# Patient Record
Sex: Female | Born: 1985 | Race: Black or African American | Hispanic: No | Marital: Single | Smoking: Never smoker
Health system: Southern US, Community
[De-identification: ages and names within clinical notes are randomized; demographics above are authoritative.]

## PROBLEM LIST (undated history)

## (undated) DIAGNOSIS — I10 Essential (primary) hypertension: Secondary | ICD-10-CM

---
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
## 2007-08-07 ENCOUNTER — Inpatient Hospital Stay (HOSPITAL_COMMUNITY): Admission: AD | Admit: 2007-08-07 | Discharge: 2007-08-10 | Payer: Self-pay | Admitting: Obstetrics

## 2007-08-08 ENCOUNTER — Encounter (INDEPENDENT_AMBULATORY_CARE_PROVIDER_SITE_OTHER): Payer: Self-pay | Admitting: Obstetrics

## 2008-07-16 ENCOUNTER — Inpatient Hospital Stay (HOSPITAL_COMMUNITY): Admission: AD | Admit: 2008-07-16 | Discharge: 2008-07-17 | Payer: Self-pay | Admitting: Obstetrics

## 2008-07-18 ENCOUNTER — Inpatient Hospital Stay (HOSPITAL_COMMUNITY): Admission: AD | Admit: 2008-07-18 | Discharge: 2008-07-20 | Payer: Self-pay | Admitting: Obstetrics

## 2009-10-30 ENCOUNTER — Ambulatory Visit: Payer: Self-pay | Admitting: Obstetrics and Gynecology

## 2009-10-30 ENCOUNTER — Inpatient Hospital Stay (HOSPITAL_COMMUNITY): Admission: AD | Admit: 2009-10-30 | Discharge: 2009-10-30 | Payer: Self-pay | Admitting: Obstetrics & Gynecology

## 2009-11-15 ENCOUNTER — Ambulatory Visit: Payer: Self-pay | Admitting: Obstetrics and Gynecology

## 2009-11-16 ENCOUNTER — Encounter: Payer: Self-pay | Admitting: Obstetrics and Gynecology

## 2009-11-29 ENCOUNTER — Ambulatory Visit: Payer: Self-pay | Admitting: Obstetrics and Gynecology

## 2009-11-30 ENCOUNTER — Encounter: Payer: Self-pay | Admitting: Obstetrics and Gynecology

## 2009-11-30 LAB — CONVERTED CEMR LAB
Clue Cells Wet Prep HPF POC: NONE SEEN
Trich, Wet Prep: NONE SEEN

## 2009-12-06 ENCOUNTER — Ambulatory Visit: Payer: Self-pay | Admitting: Obstetrics and Gynecology

## 2009-12-06 ENCOUNTER — Inpatient Hospital Stay (HOSPITAL_COMMUNITY): Admission: AD | Admit: 2009-12-06 | Discharge: 2009-12-08 | Payer: Self-pay | Admitting: Obstetrics & Gynecology

## 2009-12-06 ENCOUNTER — Ambulatory Visit: Payer: Self-pay | Admitting: Family

## 2010-01-24 ENCOUNTER — Ambulatory Visit: Payer: Self-pay | Admitting: Obstetrics and Gynecology

## 2010-10-01 LAB — CBC
HCT: 38.8 % (ref 36.0–46.0)
Hemoglobin: 13.5 g/dL (ref 12.0–15.0)
MCHC: 34.8 g/dL (ref 30.0–36.0)
RBC: 4.18 MIL/uL (ref 3.87–5.11)
RDW: 13.9 % (ref 11.5–15.5)

## 2010-10-01 LAB — POCT URINALYSIS DIP (DEVICE)
Bilirubin Urine: NEGATIVE
Glucose, UA: NEGATIVE mg/dL
Glucose, UA: NEGATIVE mg/dL
Hgb urine dipstick: NEGATIVE
Ketones, ur: NEGATIVE mg/dL
Ketones, ur: NEGATIVE mg/dL
Nitrite: NEGATIVE
Nitrite: NEGATIVE
Protein, ur: 30 mg/dL — AB
Protein, ur: NEGATIVE mg/dL
Specific Gravity, Urine: 1.02 (ref 1.005–1.030)
Specific Gravity, Urine: >=1.03 (ref 1.005–1.030)
Urobilinogen, UA: 0.2 mg/dL (ref 0.0–1.0)
Urobilinogen, UA: 1 mg/dL (ref 0.0–1.0)
pH: 6 (ref 5.0–8.0)
pH: 7 (ref 5.0–8.0)

## 2010-10-02 LAB — URINE MICROSCOPIC-ADD ON

## 2010-10-02 LAB — POCT URINALYSIS DIP (DEVICE)
Bilirubin Urine: NEGATIVE
Nitrite: NEGATIVE
Specific Gravity, Urine: 1.025 (ref 1.005–1.030)
pH: 7 (ref 5.0–8.0)

## 2010-10-02 LAB — CBC
Hemoglobin: 12.5 g/dL (ref 12.0–15.0)
MCHC: 33.9 g/dL (ref 30.0–36.0)
MCV: 92.9 fL (ref 78.0–100.0)
RBC: 3.98 MIL/uL (ref 3.87–5.11)

## 2010-10-02 LAB — URINALYSIS, ROUTINE W REFLEX MICROSCOPIC
Bilirubin Urine: NEGATIVE
Glucose, UA: NEGATIVE mg/dL
Hgb urine dipstick: NEGATIVE
Specific Gravity, Urine: 1.025 (ref 1.005–1.030)
Urobilinogen, UA: 0.2 mg/dL (ref 0.0–1.0)

## 2010-10-02 LAB — DIFFERENTIAL
Basophils Relative: 0 % (ref 0–1)
Eosinophils Absolute: 0.2 10*3/uL (ref 0.0–0.7)
Monocytes Absolute: 0.5 10*3/uL (ref 0.1–1.0)
Monocytes Relative: 5 % (ref 3–12)
Neutro Abs: 7.5 10*3/uL (ref 1.7–7.7)

## 2010-10-02 LAB — RAPID URINE DRUG SCREEN, HOSP PERFORMED
Barbiturates: NOT DETECTED
Benzodiazepines: NOT DETECTED

## 2010-10-02 LAB — TYPE AND SCREEN: ABO/RH(D): AB POS

## 2010-10-02 LAB — RPR: RPR Ser Ql: NONREACTIVE

## 2010-10-29 LAB — CBC
HCT: 34.5 % — ABNORMAL LOW (ref 36.0–46.0)
HCT: 41.2 % (ref 36.0–46.0)
Hemoglobin: 11.9 g/dL — ABNORMAL LOW (ref 12.0–15.0)
Hemoglobin: 14 g/dL (ref 12.0–15.0)
MCHC: 34.5 g/dL (ref 30.0–36.0)
MCV: 92.7 fL (ref 78.0–100.0)
MCV: 93.4 fL (ref 78.0–100.0)
RBC: 3.69 MIL/uL — ABNORMAL LOW (ref 3.87–5.11)
RBC: 4.52 MIL/uL (ref 3.87–5.11)
RDW: 14 % (ref 11.5–15.5)
WBC: 15.4 10*3/uL — ABNORMAL HIGH (ref 4.0–10.5)

## 2010-10-29 LAB — RPR: RPR Ser Ql: NONREACTIVE

## 2011-03-28 ENCOUNTER — Emergency Department (HOSPITAL_COMMUNITY)
Admission: EM | Admit: 2011-03-28 | Discharge: 2011-03-28 | Discharge status: Against Medical Advice | Payer: Self-pay | Attending: Emergency Medicine | Admitting: Emergency Medicine

## 2011-03-28 DIAGNOSIS — L02219 Cutaneous abscess of trunk, unspecified: Secondary | ICD-10-CM | POA: Insufficient documentation

## 2011-04-04 LAB — COMPREHENSIVE METABOLIC PANEL
ALT: 18
AST: 21
AST: 22
Albumin: 2.3 — ABNORMAL LOW
Albumin: 2.7 — ABNORMAL LOW
CO2: 26
Calcium: 8.2 — ABNORMAL LOW
Calcium: 9.3
Creatinine, Ser: 0.56
Creatinine, Ser: 0.59
GFR calc Af Amer: >60
GFR calc Af Amer: >60
Sodium: 136
Total Protein: 5.5 — ABNORMAL LOW
Total Protein: 6.1

## 2011-04-04 LAB — URIC ACID: Uric Acid, Serum: 4.8

## 2011-04-04 LAB — RPR: RPR Ser Ql: NONREACTIVE

## 2011-04-04 LAB — CBC
MCHC: 34.7
MCHC: 34.8
MCV: 92.7
Platelets: 265
Platelets: 297
RBC: 3.87
RDW: 13.6
RDW: 13.7
WBC: 11.1 — ABNORMAL HIGH

## 2011-08-22 ENCOUNTER — Emergency Department (HOSPITAL_BASED_OUTPATIENT_CLINIC_OR_DEPARTMENT_OTHER)
Admission: EM | Admit: 2011-08-22 | Discharge: 2011-08-22 | Disposition: A | Discharge status: Home / Self Care | Payer: Self-pay | Attending: Emergency Medicine | Admitting: Emergency Medicine

## 2011-08-22 ENCOUNTER — Encounter (HOSPITAL_BASED_OUTPATIENT_CLINIC_OR_DEPARTMENT_OTHER): Payer: Self-pay | Admitting: *Deleted

## 2011-08-22 ENCOUNTER — Emergency Department (INDEPENDENT_AMBULATORY_CARE_PROVIDER_SITE_OTHER): Payer: Self-pay

## 2011-08-22 ENCOUNTER — Other Ambulatory Visit: Payer: Self-pay

## 2011-08-22 DIAGNOSIS — R079 Chest pain, unspecified: Secondary | ICD-10-CM | POA: Insufficient documentation

## 2011-08-22 DIAGNOSIS — R0602 Shortness of breath: Secondary | ICD-10-CM | POA: Insufficient documentation

## 2011-08-22 DIAGNOSIS — R109 Unspecified abdominal pain: Secondary | ICD-10-CM

## 2011-08-22 LAB — PREGNANCY, URINE: Preg Test, Ur: NEGATIVE

## 2011-08-22 LAB — CBC
HCT: 38.8 % (ref 36.0–46.0)
Hemoglobin: 13.5 g/dL (ref 12.0–15.0)
MCHC: 34.8 g/dL (ref 30.0–36.0)
WBC: 6.9 10*3/uL (ref 4.0–10.5)

## 2011-08-22 LAB — BASIC METABOLIC PANEL
BUN: 12 mg/dL (ref 6–23)
Chloride: 103 mEq/L (ref 96–112)
GFR calc Af Amer: >90 mL/min (ref >90)
GFR calc non Af Amer: >90 mL/min (ref >90)
Glucose, Bld: 74 mg/dL (ref 70–99)
Potassium: 3.6 mEq/L (ref 3.5–5.1)
Sodium: 139 mEq/L (ref 135–145)

## 2011-08-22 LAB — TROPONIN I: Troponin I: <0.3 ng/mL (ref <0.30)

## 2011-08-22 LAB — URINALYSIS, ROUTINE W REFLEX MICROSCOPIC
Glucose, UA: NEGATIVE mg/dL
Hgb urine dipstick: NEGATIVE
Leukocytes, UA: NEGATIVE
Specific Gravity, Urine: 1.021 (ref 1.005–1.030)
pH: 6 (ref 5.0–8.0)

## 2011-08-22 NOTE — ED Notes (Signed)
Secondary Assessment- Pt reports lower abdominal pain since 2011 and insertion of IUD.  Pain is described as intermittent and cramping. She c/o of urinary frequency and vaginal odor.  Denies vaginal d/c or bleeding at present time.  She also reports intermittent chest pain described as tightness and SHOB when in a stressful situation.  Denies chest pain at present time.

## 2011-08-22 NOTE — ED Notes (Signed)
Pt states " I have been feeling like this with my body since June but I havent had time to go get it checked out" the shortness of breath just started today

## 2011-08-22 NOTE — ED Notes (Signed)
Pt concerned that the mirena implant is causing her back and abdominal pain and feels short of breath has had it for one year

## 2011-08-22 NOTE — ED Notes (Signed)
Patient transported to X-ray 

## 2011-08-22 NOTE — ED Provider Notes (Signed)
History     CSN: [id number]  Arrival date & time 08/22/11  1734   First MD Initiated Contact with Patient 08/22/11 1736      Chief Complaint  Patient presents with  . Shortness of Breath  . Abdominal Pain    (Consider location/radiation/quality/duration/timing/severity/associated sxs/prior treatment) Patient is a 26 y.o. female presenting with chest pain.  Chest Pain The chest pain began 1 - 2 weeks ago. Duration of episode(s) is 10 minutes. Chest pain occurs intermittently. The chest pain is unchanged. The pain is associated with exertion. At its most intense, the pain is at 8/10. The pain is currently at 0/10. The severity of the pain is moderate. The quality of the pain is described as aching and sharp. The pain does not radiate. Chest pain is worsened by exertion. Primary symptoms include shortness of breath and palpitations. Pertinent negatives for primary symptoms include no fever, no fatigue, no syncope, no cough, no wheezing, no abdominal pain, no nausea, no vomiting and no dizziness.  The palpitations also occurred with shortness of breath. The palpitations did not occur with dizziness.      History reviewed. No pertinent past medical history.  History reviewed. No pertinent past surgical history.  History reviewed. No pertinent family history.  History  Substance Use Topics  . Smoking status: Never Smoker   . Smokeless tobacco: Not on file  . Alcohol Use: Yes     occ    OB History    Grav Para Term Preterm Abortions TAB SAB Ect Mult Living                  Review of Systems  Constitutional: Negative for fever and fatigue.  HENT: Negative for congestion and neck pain.   Eyes: Negative for visual disturbance.  Respiratory: Positive for shortness of breath. Negative for cough and wheezing.   Cardiovascular: Positive for chest pain and palpitations. Negative for syncope.  Gastrointestinal: Negative for nausea, vomiting and abdominal pain.  Genitourinary:  Negative for dysuria.  Musculoskeletal: Negative for back pain.  Neurological: Negative for dizziness and headaches.  Hematological: Does not bruise/bleed easily.    Allergies  Review of patient's allergies indicates no known allergies.  Home Medications   Current Outpatient Rx  Name Route Sig Dispense Refill  . IBUPROFEN PO Oral Take 2 tablets by mouth daily as needed. Patient used this medication for stomach pain.      BP 128/66  Pulse 76  Temp(Src) 98.3 F (36.8 C) (Oral)  Resp 20  SpO2 99%  LMP 08/13/2011  Physical Exam  Nursing note and vitals reviewed. Constitutional: She is oriented to person, place, and time. She appears well-developed and well-nourished. No distress.  HENT:  Head: Normocephalic and atraumatic.  Mouth/Throat: Oropharynx is clear and moist.  Eyes: Conjunctivae and EOM are normal. Pupils are equal, round, and reactive to light.  Neck: Normal range of motion. Neck supple.  Cardiovascular: Normal rate, regular rhythm and normal heart sounds.   No murmur heard. Pulmonary/Chest: Effort normal and breath sounds normal. She has no wheezes. She exhibits no tenderness.  Abdominal: Soft. Bowel sounds are normal. There is no tenderness.  Musculoskeletal: Normal range of motion. She exhibits no edema.  Neurological: She is alert and oriented to person, place, and time. No cranial nerve deficit. She exhibits normal muscle tone.  Skin: Skin is warm. No rash noted.    ED Course  Procedures (including critical care time)  Labs Reviewed  URINALYSIS, ROUTINE W REFLEX MICROSCOPIC -  Abnormal; Notable for the following:    Ketones, ur 15 (*)    All other components within normal limits  PREGNANCY, URINE  CBC  BASIC METABOLIC PANEL  D-DIMER, QUANTITATIVE  TROPONIN I   Dg Chest 2 View  08/22/2011  *RADIOLOGY REPORT*  Clinical Data: 26 year old female with mid chest pain, shortness of breath.  CHEST - 2 VIEW  Comparison: 11/21/2004.  Findings: Slightly shallow  lung volumes felt to accentuate cardiac size.  Cardiac size and mediastinal contours are within normal limits.  Visualized tracheal air column is within normal limits. The lungs are clear.  No pneumothorax or effusion. No osseous abnormality identified.  IMPRESSION: Negative, no acute cardiopulmonary abnormality.  Original Report Authenticated By: Harley Hallmark, M.D.   Results for orders placed during the hospital encounter of 08/22/11  URINALYSIS, ROUTINE W REFLEX MICROSCOPIC      Component Value Range   Color, Urine YELLOW  YELLOW    APPearance CLEAR  CLEAR    Specific Gravity, Urine 1.021  1.005 - 1.030    pH 6.0  5.0 - 8.0    Glucose, UA NEGATIVE  NEGATIVE (mg/dL)   Hgb urine dipstick NEGATIVE  NEGATIVE    Bilirubin Urine NEGATIVE  NEGATIVE    Ketones, ur 15 (*) NEGATIVE (mg/dL)   Protein, ur NEGATIVE  NEGATIVE (mg/dL)   Urobilinogen, UA 0.2  0.0 - 1.0 (mg/dL)   Nitrite NEGATIVE  NEGATIVE    Leukocytes, UA NEGATIVE  NEGATIVE   PREGNANCY, URINE      Component Value Range   Preg Test, Ur NEGATIVE  NEGATIVE   CBC      Component Value Range   WBC 6.9  4.0 - 10.5 (K/uL)   RBC 4.47  3.87 - 5.11 (MIL/uL)   Hemoglobin 13.5  12.0 - 15.0 (g/dL)   HCT 16.1  09.6 - 04.5 (%)   MCV 86.8  78.0 - 100.0 (fL)   MCH 30.2  26.0 - 34.0 (pg)   MCHC 34.8  30.0 - 36.0 (g/dL)   RDW 40.9  81.1 - 91.4 (%)   Platelets 288  150 - 400 (K/uL)  BASIC METABOLIC PANEL      Component Value Range   Sodium 139  135 - 145 (mEq/L)   Potassium 3.6  3.5 - 5.1 (mEq/L)   Chloride 103  96 - 112 (mEq/L)   CO2 25  19 - 32 (mEq/L)   Glucose, Bld 74  70 - 99 (mg/dL)   BUN 12  6 - 23 (mg/dL)   Creatinine, Ser 7.82  0.50 - 1.10 (mg/dL)   Calcium 9.9  8.4 - 95.6 (mg/dL)   GFR calc non Af Amer >90  >90 (mL/min)   GFR calc Af Amer >90  >90 (mL/min)  D-DIMER, QUANTITATIVE      Component Value Range   D-Dimer, Quant <0.22  0.00 - 0.48 (ug/mL-FEU)  TROPONIN I      Component Value Range   Troponin I <0.30  <0.30 (ng/mL)     Date: 08/22/2011  Rate: 83  Rhythm: normal sinus rhythm  QRS Axis: normal  Intervals: normal  ST/T Wave abnormalities: nonspecific ST/T changes  Conduction Disutrbances:right bundle branch block  Narrative Interpretation:   Old EKG Reviewed: none available    1. Chest pain   2. Shortness of breath       MDM   Patient with a complaint of chest pain for one week occasional feeling of palpitations and shortness of breath. Patient with new job and  thinks it may be related to anxiety. Workup in the emergency department negative for pneumonia or pneumothorax cardiac arrhythmia pulmonary embolism or acute cardiac event. Patient informed of the finding of a right bundle branch block on her EKG but not likely to be related to her symptoms.       Shelda Jakes, MD 08/22/11 [phone]

## 2011-12-16 IMAGING — US US OB COMP +14 WK
1 series · 14 of 28 positions shown · non-contrast
Comparison: none

OBSTETRICAL ULTRASOUND:
 This ultrasound exam was performed in the [HOSPITAL] Ultrasound Department.  The OB US report was generated in the AS system, and faxed to the ordering physician.  This report is also available in [HOSPITAL]?s [employer] and in [REDACTED] PACS.

[Series 1: us ob comp +14 wk · 0.37mm/px · 14 of 43 slices shown]
[im 2/43]
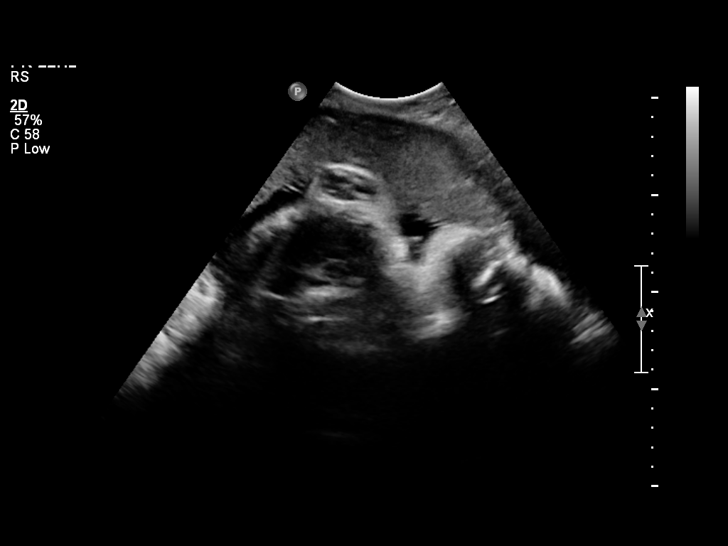
[im 5/43]
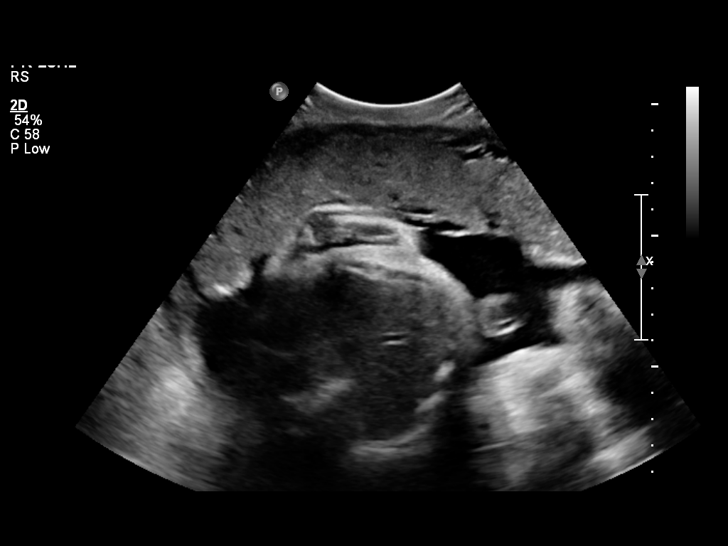
[im 8/43]
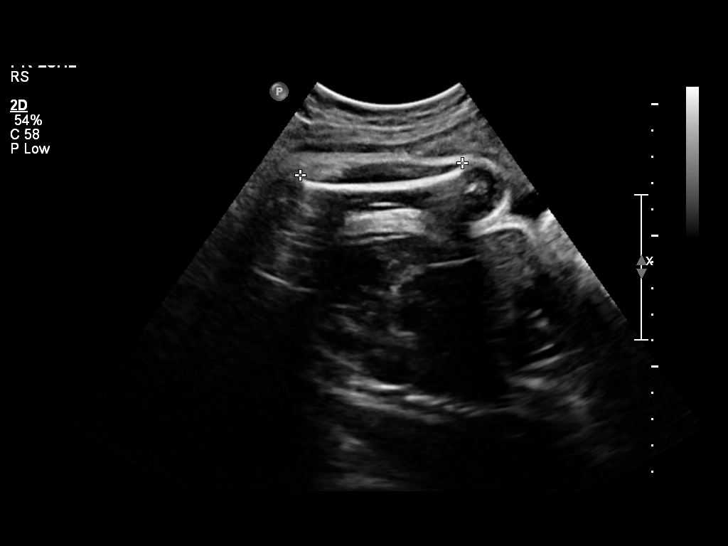
[im 11/43]
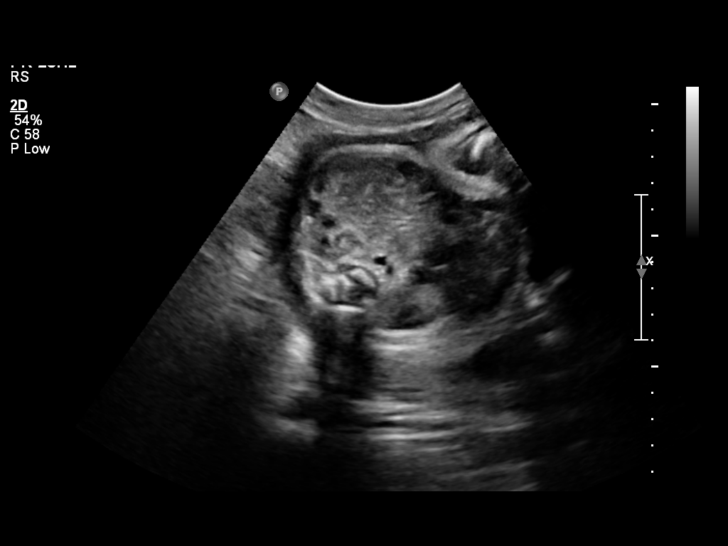
[im 15/43]
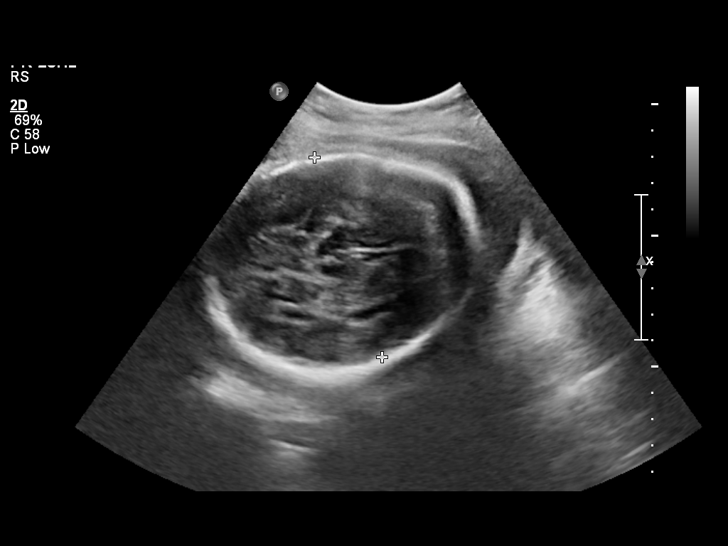
[im 18/43]
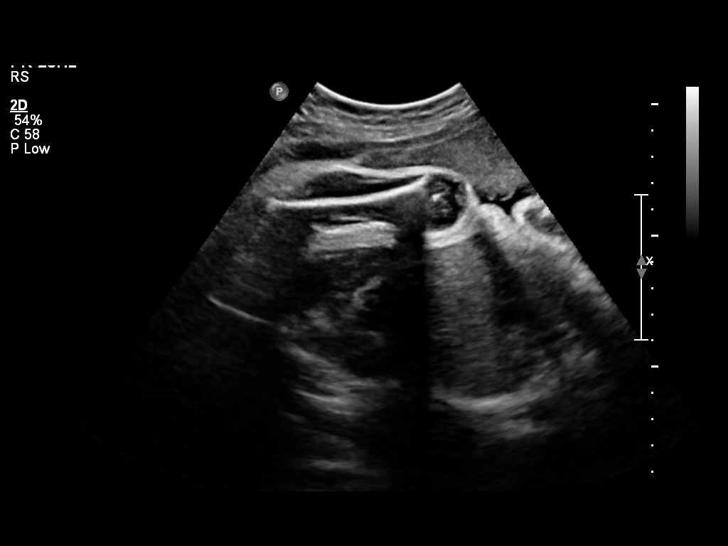
[im 21/43]
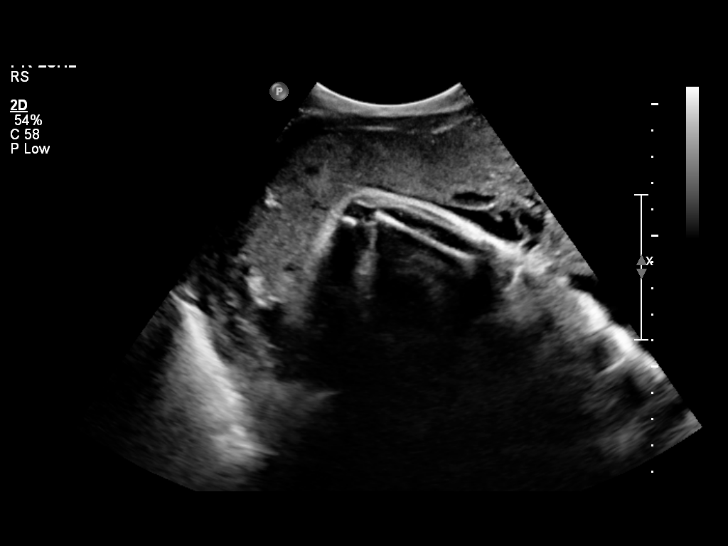
[im 24/43]
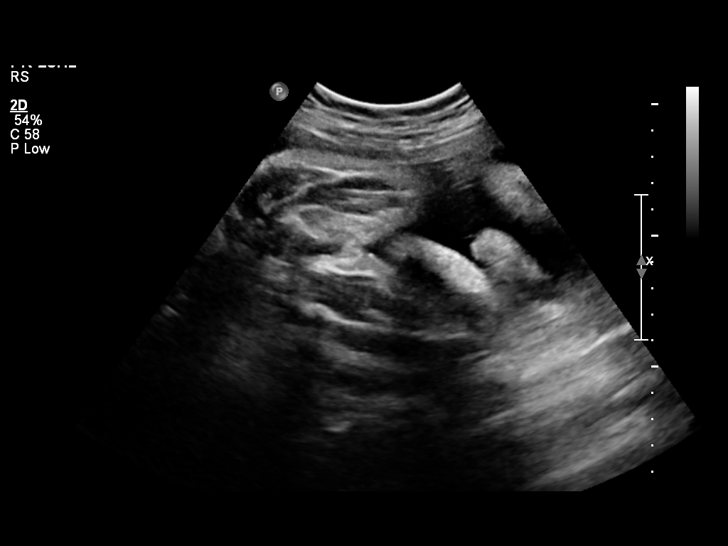
[im 27/43]
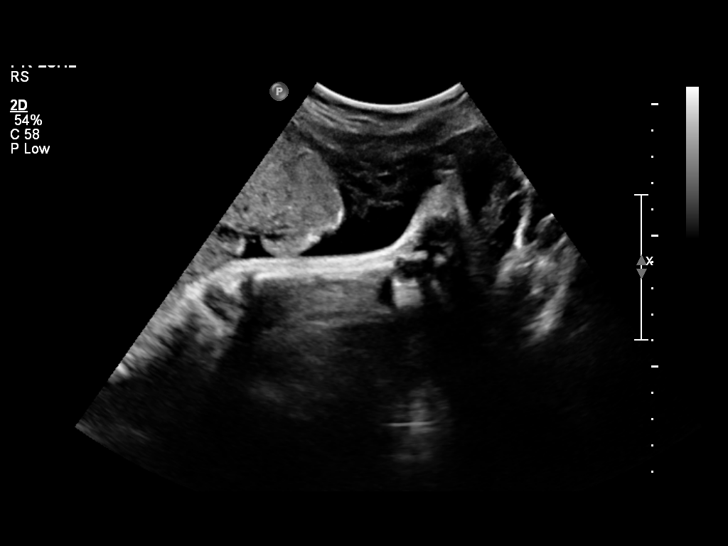
[im 30/43]
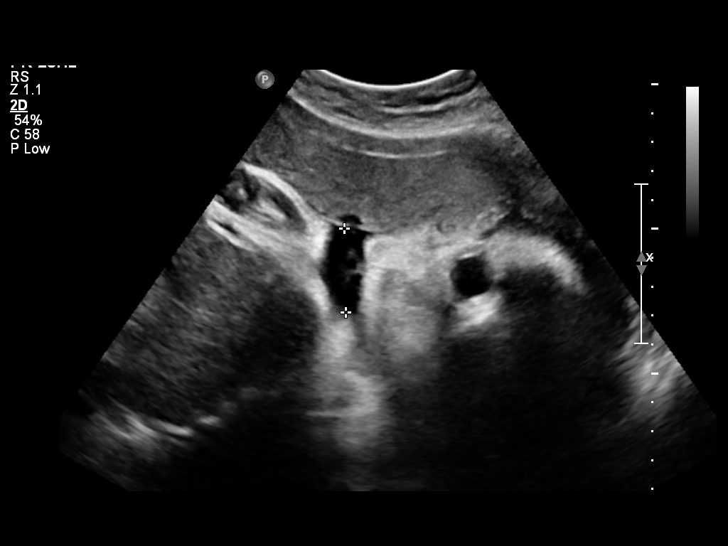
[im 33/43]
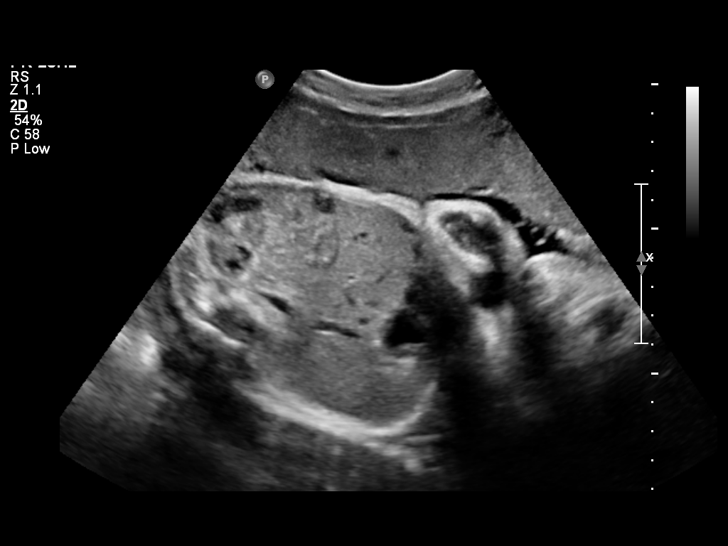
[im 36/43]
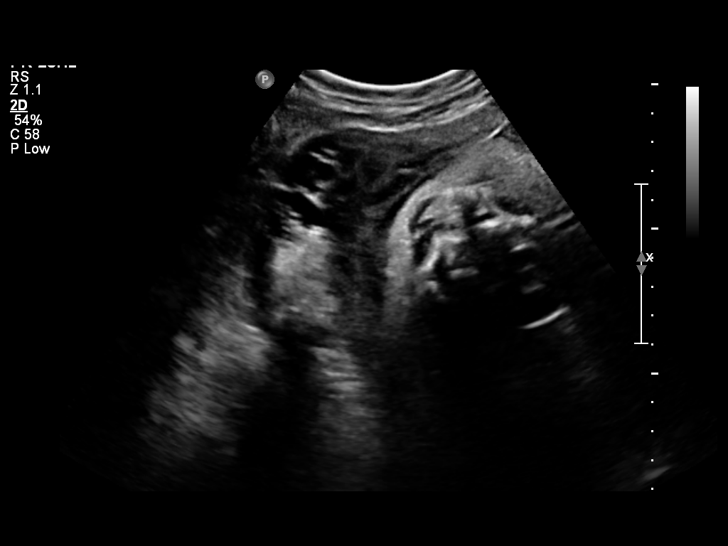
[im 39/43]
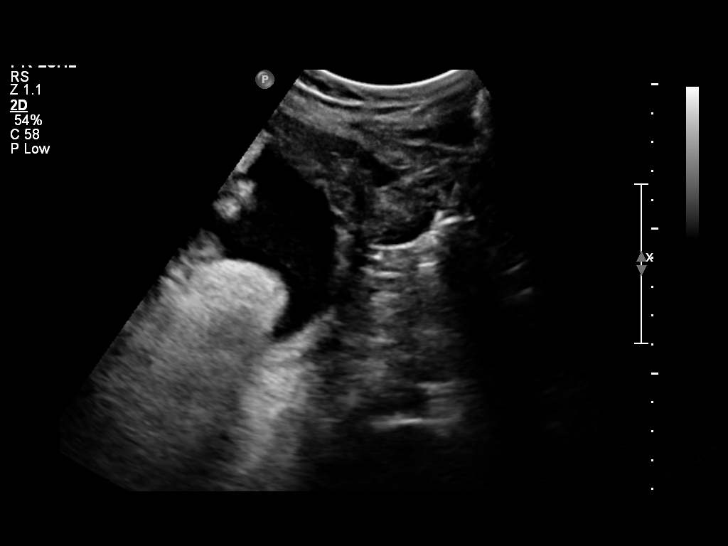
[im 43/43]
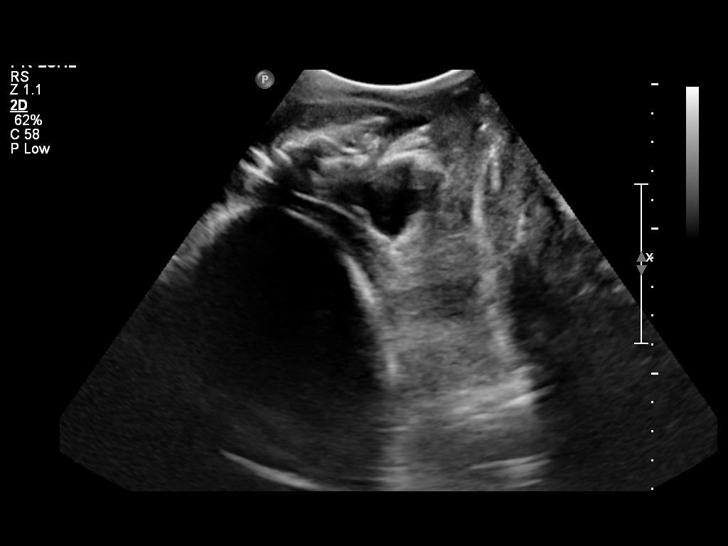

[14 of 28 positions shown; findings below may reference images not displayed]

IMPRESSION: See [facility] Obstetric US report.

## 2016-05-05 ENCOUNTER — Emergency Department (HOSPITAL_COMMUNITY)
Admission: EM | Admit: 2016-05-05 | Discharge: 2016-05-05 | Disposition: A | Discharge status: Home / Self Care | Payer: Self-pay | Attending: Emergency Medicine | Admitting: Emergency Medicine

## 2016-05-05 ENCOUNTER — Emergency Department (HOSPITAL_COMMUNITY): Payer: Self-pay

## 2016-05-05 ENCOUNTER — Encounter (HOSPITAL_COMMUNITY): Payer: Self-pay | Admitting: Emergency Medicine

## 2016-05-05 DIAGNOSIS — I10 Essential (primary) hypertension: Secondary | ICD-10-CM | POA: Insufficient documentation

## 2016-05-05 DIAGNOSIS — Y9389 Activity, other specified: Secondary | ICD-10-CM | POA: Insufficient documentation

## 2016-05-05 DIAGNOSIS — M25571 Pain in right ankle and joints of right foot: Secondary | ICD-10-CM | POA: Insufficient documentation

## 2016-05-05 DIAGNOSIS — Y929 Unspecified place or not applicable: Secondary | ICD-10-CM | POA: Insufficient documentation

## 2016-05-05 DIAGNOSIS — Y999 Unspecified external cause status: Secondary | ICD-10-CM | POA: Insufficient documentation

## 2016-05-05 DIAGNOSIS — X501XXA Overexertion from prolonged static or awkward postures, initial encounter: Secondary | ICD-10-CM | POA: Insufficient documentation

## 2016-05-05 HISTORY — DX: Essential (primary) hypertension: I10

## 2016-05-05 MED ORDER — HYDROCODONE-ACETAMINOPHEN 5-325 MG PO TABS
1.0000 | ORAL_TABLET | Freq: Once | ORAL | Status: AC
  Administered 2016-05-05: 1 via ORAL
  Filled 2016-05-05: qty 1

## 2016-05-05 MED ORDER — IBUPROFEN 800 MG PO TABS: 800.0000 mg | ORAL_TABLET | Freq: Three times a day (TID) | ORAL | 0 refills | Status: DC

## 2016-05-05 MED ORDER — HYDROCODONE-ACETAMINOPHEN 5-325 MG PO TABS: 1.0000 | ORAL_TABLET | Freq: Four times a day (QID) | ORAL | 0 refills | Status: DC | PRN

## 2016-05-05 NOTE — Discharge Instructions (Signed)
Medications: Norco, ibuprofen  Treatment: Take Norco every 4-6 hours as needed for severe pain. Do not drive or operate machinery when taking this medication and only take as prescribed. Take ibuprofen every 8 hours as needed for mild to moderate pain or when you're driving. Use ice 3-4 times daily alternating 20 minutes on, 20 minutes off. Keep your splint on at all times except when bathing. Keep your leg elevated whenever you are sitting or laying down. Use crutches to ambulate carefully.  Follow-up: Please follow-up with Dr. Roda ShuttersXu, an orthopedic doctor, if your symptoms are not improving over the next 10-14 days. Please return to emergency department if you develop any new or worsening symptoms.

## 2016-05-05 NOTE — ED Provider Notes (Signed)
[facility] Provider Note   CSN: [id number] Arrival date & time: 05/05/16  1911  By signing my name below, I, Sheila Arroyo, attest that this documentation has been prepared under the direction and in the presence of  No att. providers found. Electronically Signed: Lennie Muckleyan Arroyo, Scribe. 05/06/16. 10:12 AM.   History   Chief Complaint Chief Complaint  Patient presents with  . Ankle Injury   The history is provided by the patient. No language interpreter was used.  Ankle Injury  Pertinent negatives include no chest pain, no abdominal pain, no headaches and no shortness of breath.     HPI Comments: Sheila Arroyo is a 30 y.o. female who presents to the Emergency Department complaining of right ankle pain after twisting it earlier today while using stairs. The pain has gotten progressively worse as the day goes on and cannot walk on the right foot. Had some tingling in the right ankle but this resolved. Does not remember which way the ankle twisted. Took Aleve for pain relief.   Past Medical History:  Diagnosis Date  . Hypertension     There are no active problems to display for this patient.   No past surgical history on file.  OB History    No data available       Home Medications    Prior to Admission medications   Medication Sig Start Date End Date Taking? Authorizing Provider  HYDROcodone-acetaminophen (NORCO/VICODIN) 5-325 MG tablet Take 1-2 tablets by mouth every 6 (six) hours as needed. 05/05/16   Emi HolesAlexandra M Ovella Manygoats, PA-C  ibuprofen (ADVIL,MOTRIN) 800 MG tablet Take 1 tablet (800 mg total) by mouth 3 (three) times daily. 05/05/16   Emi HolesAlexandra M Dervin Vore, PA-C    Family History No family history on file.  Social History Social History  Substance Use Topics  . Smoking status: Never Smoker  . Smokeless tobacco: Not on file  . Alcohol use Yes     Comment: occ     Allergies   Review of patient's allergies indicates no known allergies.   Review of  Systems Review of Systems  Constitutional: Negative for chills and fever.  HENT: Negative for facial swelling and sore throat.   Respiratory: Negative for shortness of breath.   Cardiovascular: Negative for chest pain.  Gastrointestinal: Negative for abdominal pain, nausea and vomiting.  Genitourinary: Negative for dysuria.  Musculoskeletal: Negative for back pain.       Right ankle pain  Skin: Negative for rash and wound.  Neurological: Negative for headaches.  Psychiatric/Behavioral: The patient is not nervous/anxious.      Physical Exam Updated Vital Signs BP (!) 151/106 (BP Location: Right Arm)   Pulse 115   Temp 98.4 F (36.9 C) (Oral)   Resp 18   LMP 04/14/2016 (Approximate)   SpO2 100%   Physical Exam  Constitutional: She appears well-developed and well-nourished. No distress.  HENT:  Head: Normocephalic and atraumatic.  Mouth/Throat: Oropharynx is clear and moist. No oropharyngeal exudate.  Eyes: Conjunctivae are normal. Pupils are equal, round, and reactive to light. Right eye exhibits no discharge. Left eye exhibits no discharge. No scleral icterus.  Neck: Normal range of motion. Neck supple. No thyromegaly present.  Cardiovascular: Normal rate, regular rhythm and normal heart sounds.  Exam reveals no gallop and no friction rub.   No murmur heard. Pulmonary/Chest: Effort normal and breath sounds normal. No stridor. No respiratory distress. She has no wheezes. She has no rales.  Abdominal: Soft. Bowel sounds are normal.  She exhibits no distension. There is no tenderness. There is no rebound and no guarding.  Musculoskeletal: She exhibits no edema.  Anterior tenderness, mild lateral malleolar tenderness, DP pulses intact, no tenderness at base of 5th metacarpal or fibular head  Lymphadenopathy:    She has no cervical adenopathy.  Neurological: She is alert. Coordination normal.  Skin: Skin is warm and dry. No rash noted. She is not diaphoretic. No pallor.   Psychiatric: She has a normal mood and affect.  Nursing note and vitals reviewed.    ED Treatments / Results  Labs (all labs ordered are listed, but only abnormal results are displayed) Labs Reviewed - No data to display  EKG  EKG Interpretation None       Radiology Dg Ankle Complete Right  Result Date: 05/05/2016 CLINICAL DATA:  Twisted right ankle on stairs at home earlier today. Lateral ankle swelling. Initial encounter. EXAM: RIGHT ANKLE - COMPLETE 3+ VIEW COMPARISON:  None. FINDINGS: There is no evidence of fracture, dislocation, or joint effusion. There is no evidence of arthropathy or other focal bone abnormality. Soft tissues are unremarkable. IMPRESSION: Negative. Electronically Signed   By: Sebastian Ache M.D.   On: 05/05/2016 20:40    Procedures Procedures (including critical care time)  Medications Ordered in ED Medications  HYDROcodone-acetaminophen (NORCO/VICODIN) 5-325 MG per tablet 1 tablet (1 tablet Oral Given 05/05/16 2122)     Initial Impression / Assessment and Plan / ED Course  I have reviewed the triage vital signs and the nursing notes.  Pertinent labs & imaging results that were available during my care of the patient were reviewed by me and considered in my medical decision making (see chart for details).  Clinical Course   MDM Patient X-Ray negative for obvious fracture or dislocation.  Pt advised to follow up with orthopedics as needed. Patient given ASO splint and crutches while in ED, conservative therapy recommended and discussed.Discharged home with short course of analgesia. Patient will be discharged home & is agreeable with above plan. Returns precautions discussed. Pt appears safe for discharge.  I personally performed the services described in this documentation, which was scribed in my presence. The recorded information has been reviewed and is accurate.   Final Clinical Impressions(s) / ED Diagnoses   Final diagnoses:  Acute right  ankle pain    New Prescriptions Discharge Medication List as of 05/05/2016  9:09 PM    START taking these medications   Details  HYDROcodone-acetaminophen (NORCO/VICODIN) 5-325 MG tablet Take 1-2 tablets by mouth every 6 (six) hours as needed., Starting Sun 05/05/2016, Print          Emi Holes, PA-C 05/06/16 1012    Rolan Bucco, MD 05/10/16 [phone]

## 2016-05-05 NOTE — ED Notes (Signed)
1920 pain scale reported by patient 8/10

## 2016-05-05 NOTE — ED Notes (Signed)
Patient d/c'd self care.  F/U and medications reviewed.  Patient verbalized understanding. 

## 2016-05-05 NOTE — ED Triage Notes (Signed)
Pt states that she twisted her R ankle earlier today and the pain got worse throughout the day. Alert and oriented. No deformity noted.

## 2019-11-06 ENCOUNTER — Ambulatory Visit (HOSPITAL_COMMUNITY)
Admission: EM | Admit: 2019-11-06 | Discharge: 2019-11-06 | Disposition: A | Discharge status: Home / Self Care | Payer: PRIVATE HEALTH INSURANCE

## 2019-11-06 ENCOUNTER — Other Ambulatory Visit: Payer: Self-pay

## 2019-11-06 ENCOUNTER — Encounter (HOSPITAL_COMMUNITY): Payer: Self-pay

## 2019-11-06 DIAGNOSIS — M79672 Pain in left foot: Secondary | ICD-10-CM

## 2019-11-06 DIAGNOSIS — M7989 Other specified soft tissue disorders: Secondary | ICD-10-CM

## 2019-11-06 DIAGNOSIS — S96912A Strain of unspecified muscle and tendon at ankle and foot level, left foot, initial encounter: Secondary | ICD-10-CM

## 2019-11-06 MED ORDER — PREDNISONE 20 MG PO TABS
ORAL_TABLET | ORAL | 0 refills | Status: DC
Start: 2019-11-06 — End: 2021-11-13

## 2019-11-06 NOTE — ED Triage Notes (Signed)
Pt is here with left foot pain that started Tuesday, pt has taken Advil, ice, elevated it to relieve discomfort.

## 2019-11-06 NOTE — ED Provider Notes (Addendum)
  [facility]   MRN: [id number] DOB: 09-May-1986  Subjective:   Sheila Arroyo is a 34 y.o. female presenting for 5-day history of persistent left sided foot pain.  Patient cannot recall any particular trauma but thinks she may have twisted her foot, she did use shoes she does not normally use as well.  Patient has used Advil, Aleve and icing.  Has elevated as well.  However, she has worked throughout the entire week and has not rested at all.  Denies history of chronic conditions.  Denies taking any chronic medications.   No Known Allergies  Past Medical History:  Diagnosis Date  . Hypertension      No past surgical history on file.  No family history on file.  Social History   Tobacco Use  . Smoking status: Never Smoker  Substance Use Topics  . Alcohol use: Yes    Comment: occ  . Drug use: Yes    Types: Marijuana    ROS   Objective:   Vitals: BP (!) 163/89 (BP Location: Right Arm)   Pulse 78   Temp 98.5 F (36.9 C) (Oral)   Resp 17   Wt 163 lb 3.2 oz (74 kg)   LMP 10/24/2019   SpO2 100%   Physical Exam Constitutional:      General: She is not in acute distress.    Appearance: Normal appearance. She is well-developed. She is obese. She is not ill-appearing, toxic-appearing or diaphoretic.  HENT:     Head: Normocephalic and atraumatic.     Nose: Nose normal.     Mouth/Throat:     Mouth: Mucous membranes are moist.     Pharynx: Oropharynx is clear.  Eyes:     General: No scleral icterus.    Extraocular Movements: Extraocular movements intact.     Pupils: Pupils are equal, round, and reactive to light.  Cardiovascular:     Rate and Rhythm: Normal rate.  Pulmonary:     Effort: Pulmonary effort is normal.  Musculoskeletal:     Left foot: Normal range of motion and normal capillary refill. Swelling (trace laterally) and tenderness present. No deformity, laceration, bony tenderness or crepitus.       Feet:  Skin:    General: Skin is warm and  dry.  Neurological:     General: No focal deficit present.     Mental Status: She is alert and oriented to person, place, and time.  Psychiatric:        Mood and Affect: Mood normal.        Behavior: Behavior normal.    Patient's left foot wrapped using 3 inch Ace wrap.  Assessment and Plan :   PDMP not reviewed this encounter.  1. Foot pain, left   2. Strain of left foot, initial encounter   3. Swelling of left foot     Recommended conservative management and insisted on rest from work.  Given treatment failure with Advil and Aleve, will use prednisone course.  Recommended continued icing.  Given lack of trauma, physical exam findings supporting need for an x-ray will defer imaging until Tuesday if no improvement.  Patient instructed to return to clinic at that point if symptoms fail to improve. Counseled patient on potential for adverse effects with medications prescribed/recommended today, ER and return-to-clinic precautions discussed, patient verbalized understanding.    Wallis Bamberg, PA-C 11/06/19 1122

## 2021-11-12 ENCOUNTER — Encounter (HOSPITAL_BASED_OUTPATIENT_CLINIC_OR_DEPARTMENT_OTHER): Payer: Self-pay

## 2021-11-12 ENCOUNTER — Other Ambulatory Visit: Payer: Self-pay

## 2021-11-12 DIAGNOSIS — R030 Elevated blood-pressure reading, without diagnosis of hypertension: Secondary | ICD-10-CM | POA: Insufficient documentation

## 2021-11-12 DIAGNOSIS — E876 Hypokalemia: Secondary | ICD-10-CM | POA: Insufficient documentation

## 2021-11-12 DIAGNOSIS — D649 Anemia, unspecified: Secondary | ICD-10-CM | POA: Diagnosis not present

## 2021-11-12 DIAGNOSIS — N390 Urinary tract infection, site not specified: Secondary | ICD-10-CM | POA: Insufficient documentation

## 2021-11-12 DIAGNOSIS — R109 Unspecified abdominal pain: Secondary | ICD-10-CM | POA: Diagnosis present

## 2021-11-12 LAB — COMPREHENSIVE METABOLIC PANEL
ALT: 16 U/L (ref 0–44)
AST: 18 U/L (ref 15–41)
Albumin: 4.3 g/dL (ref 3.5–5.0)
Alkaline Phosphatase: 79 U/L (ref 38–126)
Anion gap: 6 (ref 5–15)
BUN: 16 mg/dL (ref 6–20)
CO2: 26 mmol/L (ref 22–32)
Calcium: 9.6 mg/dL (ref 8.9–10.3)
Chloride: 105 mmol/L (ref 98–111)
Creatinine, Ser: 0.68 mg/dL (ref 0.44–1.00)
GFR, Estimated: >60 mL/min (ref >60)
Glucose, Bld: 78 mg/dL (ref 70–99)
Potassium: 3.4 mmol/L — ABNORMAL LOW (ref 3.5–5.1)
Sodium: 137 mmol/L (ref 135–145)
Total Bilirubin: 0.6 mg/dL (ref 0.3–1.2)
Total Protein: 7.6 g/dL (ref 6.5–8.1)

## 2021-11-12 LAB — URINALYSIS, ROUTINE W REFLEX MICROSCOPIC
Bilirubin Urine: NEGATIVE
Glucose, UA: 100 mg/dL — AB
Ketones, ur: NEGATIVE mg/dL
Nitrite: POSITIVE — AB
Protein, ur: 100 mg/dL — AB
Specific Gravity, Urine: >=1.03 (ref 1.005–1.030)
pH: 5 (ref 5.0–8.0)

## 2021-11-12 LAB — URINALYSIS, MICROSCOPIC (REFLEX)

## 2021-11-12 LAB — CBC
HCT: 35.7 % — ABNORMAL LOW (ref 36.0–46.0)
Hemoglobin: 11.8 g/dL — ABNORMAL LOW (ref 12.0–15.0)
MCH: 29 pg (ref 26.0–34.0)
MCHC: 33.1 g/dL (ref 30.0–36.0)
MCV: 87.7 fL (ref 80.0–100.0)
Platelets: 319 10*3/uL (ref 150–400)
RBC: 4.07 MIL/uL (ref 3.87–5.11)
RDW: 12.8 % (ref 11.5–15.5)
WBC: 8.5 10*3/uL (ref 4.0–10.5)
nRBC: 0 % (ref 0.0–0.2)

## 2021-11-12 LAB — LIPASE, BLOOD: Lipase: 32 U/L (ref 11–51)

## 2021-11-12 LAB — PREGNANCY, URINE: Preg Test, Ur: NEGATIVE

## 2021-11-12 NOTE — ED Triage Notes (Signed)
Patient presents with complaint of lower abdominal pain which radiates to back and dysuria since Saturday.  Attempted taking Azo at home with no relief of symptoms.  ?

## 2021-11-13 ENCOUNTER — Emergency Department (HOSPITAL_BASED_OUTPATIENT_CLINIC_OR_DEPARTMENT_OTHER)
Admission: EM | Admit: 2021-11-13 | Discharge: 2021-11-13 | Disposition: A | Discharge status: Home / Self Care | Payer: Managed Care, Other (non HMO) | Attending: Emergency Medicine | Admitting: Emergency Medicine

## 2021-11-13 DIAGNOSIS — R030 Elevated blood-pressure reading, without diagnosis of hypertension: Secondary | ICD-10-CM

## 2021-11-13 DIAGNOSIS — N390 Urinary tract infection, site not specified: Secondary | ICD-10-CM

## 2021-11-13 MED ORDER — NITROFURANTOIN MONOHYD MACRO 100 MG PO CAPS
100.0000 mg | ORAL_CAPSULE | Freq: Once | ORAL | Status: AC
  Administered 2021-11-13: 100 mg via ORAL
  Filled 2021-11-13: qty 1

## 2021-11-13 MED ORDER — NITROFURANTOIN MONOHYD MACRO 100 MG PO CAPS: 100.0000 mg | ORAL_CAPSULE | Freq: Two times a day (BID) | ORAL | 0 refills | Status: AC

## 2021-11-13 NOTE — Discharge Instructions (Addendum)
Drink plenty of fluids. ? ?You may take ibuprofen and/or acetaminophen as needed for pain. ? ?Return to the emergency department if you start running a high fever, or if pain is getting worse. ? ?Your blood pressure was elevated today.  Please have it rechecked several times over the next 2 weeks.  If it continues to be high, you will need to be treated for it.  Inadequately treated high blood pressure can lead to heart attacks, strokes, kidney failure. ?

## 2021-11-13 NOTE — ED Notes (Signed)
Pt agreeable with d/c plan as discussed by provider- this nurse has verbally reinforced d/c instructions and provided pt with written copy - pt acknowledges verbal understanding and denies any additional questions, concerns, needs- ambulatory independently at discharge- no distress-- BP remains elevated (provider aware - pt f/u with pcp for bp monitoring/mgmt)   ?

## 2021-11-13 NOTE — ED Provider Notes (Signed)
?[facility] EMERGENCY DEPARTMENT ?Provider Note ? ? ?CSN: [id number] ?Arrival date & time: 11/12/21  2247 ? ?  ? ?History ? ?Chief Complaint  ?Patient presents with  ? Abdominal Pain  ? Dysuria  ? ? ?Sheila Arroyo is a 36 y.o. female. ? ?The history is provided by the patient.  ?Abdominal Pain ?Associated symptoms: dysuria   ?Dysuria ?Associated symptoms: abdominal pain   ?She had onset 2 days ago of dysuria with associated urinary urgency, frequency, tenesmus.  She started taking over-the-counter phenazopyridine, but it was not giving her any relief.  She denies any nausea or vomiting.  Today, she has started develop some suprapubic pain and, on one occasion, pain went into her back.  She denies fever, chills, sweats.  She has not had problems with urinary tract infections for the last 10 years. ?  ?Home Medications ?Prior to Admission medications   ?Medication Sig Start Date End Date Taking? Authorizing Provider  ?nitrofurantoin, macrocrystal-monohydrate, (MACROBID) 100 MG capsule Take 1 capsule (100 mg total) by mouth 2 (two) times daily. X 7 days 11/13/21  Yes Dione Booze, MD  ?dicyclomine (BENTYL) 10 MG capsule Take by mouth. 12/04/16   [provider]  ?DULoxetine (CYMBALTA) 30 MG capsule Take by mouth. 01/02/17   [provider]  ?DULOXETINE HCL PO duloxetine    [provider]  ?ergocalciferol (VITAMIN D2) 1.25 MG (50000 UT) capsule Vitamin D2 1,250 mcg (50,000 unit) capsule 01/02/17   [provider]  ?ibuprofen (ADVIL) 200 MG tablet ibuprofen 200 mg tablet ? Take 4 tablets as needed by oral route.    [provider]  ?norethindrone-ethinyl estradiol (MICROGESTIN) 1-20 MG-MCG tablet Microgestin FE 1/20 (28) 1 mg-20 mcg (21)/75 mg (7) tablet ? TK 1 T PO  QD    [provider]  ?nystatin ointment (MYCOSTATIN) nystatin 100,000 unit/gram topical ointment ? APP AA 2-3 TIMES A DAY    [provider]  ?omeprazole (PRILOSEC) 20 MG capsule Take  by mouth. 01/21/17   [provider]  ?triamcinolone ointment (KENALOG) 0.1 % triamcinolone acetonide 0.1 % topical ointment ? APP A THIN LAYER TO AA 2-3 TIMES A DAY    [provider]  ?   ? ?Allergies    ?Patient has no known allergies.   ? ?Review of Systems   ?Review of Systems  ?Gastrointestinal:  Positive for abdominal pain.  ?Genitourinary:  Positive for dysuria.  ?All other systems reviewed and are negative. ? ?Physical Exam ?Updated Vital Signs ?BP (!) 168/93 (BP Location: Right Arm)   Pulse 84   Temp 98.3 ?F (36.8 ?C) (Oral)   Resp 16   Ht 5\' 3"  (1.6 m)   Wt 73.9 kg   LMP 10/30/2021 (Approximate)   SpO2 97%   BMI 28.87 kg/m?  ?Physical Exam ?Vitals and nursing note reviewed.  ?36 year old female, resting comfortably and in no acute distress. Vital signs are significant for elevated blood pressure. Oxygen saturation is 97%, which is normal. ?Head is normocephalic and atraumatic. PERRLA, EOMI. Oropharynx is clear. ?Neck is nontender and supple without adenopathy or JVD. ?Back is nontender and there is no CVA tenderness. ?Lungs are clear without rales, wheezes, or rhonchi. ?Chest is nontender. ?Heart has regular rate and rhythm without murmur. ?Abdomen is soft, flat, with minimal suprapubic tenderness.  There is no rebound or guarding.  There are no masses or hepatosplenomegaly and peristalsis is normoactive. ?Extremities have no cyanosis or edema, full range of motion is present. ?Skin  is warm and dry without rash. ?Neurologic: Mental status is normal, cranial nerves are intact, moves all extremities equally. ? ?ED Results / Procedures / Treatments   ?Labs ?(all labs ordered are listed, but only abnormal results are displayed) ?Labs Reviewed  ?COMPREHENSIVE METABOLIC PANEL - Abnormal; Notable for the following components:  ?    Result Value  ? Potassium 3.4 (*)   ? All other components within normal limits  ?CBC - Abnormal; Notable for the following components:  ? Hemoglobin 11.8 (*)    ? HCT 35.7 (*)   ? All other components within normal limits  ?URINALYSIS, ROUTINE W REFLEX MICROSCOPIC - Abnormal; Notable for the following components:  ? Color, Urine ORANGE (*)   ? APPearance HAZY (*)   ? Glucose, UA 100 (*)   ? Hgb urine dipstick SMALL (*)   ? Protein, ur 100 (*)   ? Nitrite POSITIVE (*)   ? Leukocytes,Ua SMALL (*)   ? All other components within normal limits  ?URINALYSIS, MICROSCOPIC (REFLEX) - Abnormal; Notable for the following components:  ? Bacteria, UA MANY (*)   ? Non Squamous Epithelial PRESENT (*)   ? All other components within normal limits  ?LIPASE, BLOOD  ?PREGNANCY, URINE  ? ?Procedures ?Procedures  ? ? ?Medications Ordered in ED ?Medications  ?nitrofurantoin (macrocrystal-monohydrate) (MACROBID) capsule 100 mg (has no administration in time range)  ? ? ?ED Course/ Medical Decision Making/ A&P ?  ?                        ?Medical Decision Making ?Risk ?Prescription drug management. ? ? ?Dysuria consistent with urinary tract infection.  No fever or significant flank pain or tenderness to suggest pyelonephritis.  Labs show mild anemia which is probably not clinically significant, minimal hypokalemia.  Urinalysis does show evidence of infection with 21-50 WBCs per high-power field, many bacteria although it was a contaminated specimen with non-squamous epithelial cells present.  Clinically, she has a UTI and will be treated.  She is given a dose of nitrofurantoin and discharged with a 7-day course of same.  She is also advised to have her blood pressure rechecked as an outpatient to make sure that she is not actually hypertensive. ? ? ? ? ? ? ? ?Final Clinical Impression(s) / ED Diagnoses ?Final diagnoses:  ?Urinary tract infection without hematuria, site unspecified  ?Elevated blood pressure reading without diagnosis of hypertension  ? ? ?Rx / DC Orders ?ED Discharge Orders   ? ?      Ordered  ?  nitrofurantoin, macrocrystal-monohydrate, (MACROBID) 100 MG capsule  2 times daily        ? 11/13/21 0056  ? ?  ?  ? ?  ? ? ?  ?Dione Booze, MD ?11/13/21 0111 ? ?

## 2021-11-13 NOTE — ED Notes (Signed)
Pt awake and alert; GCS 15.  Reports dysuria with intermittent pelvic pain -- denies associated n/v/d; denies fever/chills -- pt reports OTC AZO for symptom relief.  Now oob to bathroom   ?

## 2023-05-19 ENCOUNTER — Other Ambulatory Visit: Payer: Self-pay

## 2023-05-19 ENCOUNTER — Emergency Department (HOSPITAL_BASED_OUTPATIENT_CLINIC_OR_DEPARTMENT_OTHER)
Admission: EM | Admit: 2023-05-19 | Discharge: 2023-05-19 | Disposition: A | Discharge status: Home / Self Care | Payer: Medicaid Other | Attending: Emergency Medicine | Admitting: Emergency Medicine

## 2023-05-19 ENCOUNTER — Encounter (HOSPITAL_BASED_OUTPATIENT_CLINIC_OR_DEPARTMENT_OTHER): Payer: Self-pay

## 2023-05-19 DIAGNOSIS — I10 Essential (primary) hypertension: Secondary | ICD-10-CM | POA: Insufficient documentation

## 2023-05-19 DIAGNOSIS — J069 Acute upper respiratory infection, unspecified: Secondary | ICD-10-CM | POA: Insufficient documentation

## 2023-05-19 DIAGNOSIS — Z20822 Contact with and (suspected) exposure to covid-19: Secondary | ICD-10-CM | POA: Diagnosis not present

## 2023-05-19 DIAGNOSIS — B9789 Other viral agents as the cause of diseases classified elsewhere: Secondary | ICD-10-CM | POA: Insufficient documentation

## 2023-05-19 DIAGNOSIS — R059 Cough, unspecified: Secondary | ICD-10-CM | POA: Diagnosis present

## 2023-05-19 LAB — RESP PANEL BY RT-PCR (RSV, FLU A&B, COVID)  RVPGX2
Influenza A by PCR: NEGATIVE
Influenza B by PCR: NEGATIVE
Resp Syncytial Virus by PCR: NEGATIVE
SARS Coronavirus 2 by RT PCR: NEGATIVE

## 2023-05-19 NOTE — ED Triage Notes (Signed)
Pt recently back from a trip to [place] and has flu like symptoms. Pt also endorses cough with SOB from persistent coughing. Pt vomited today and it was all spit/phlegm.

## 2023-05-19 NOTE — ED Provider Notes (Signed)
[place] EMERGENCY DEPARTMENT AT [facility] Provider Note   CSN: [id number] Arrival date & time: 05/19/23  2036     History  Chief Complaint  Patient presents with   URI    Sheila Arroyo is a 37 y.o. female with medical history of hypertension.  She presents to the ED for evaluation of URI symptoms.  States that for the last 1 week she has had cough, congestion, slight sore throat, bodyaches and chills.  Denies fevers, trouble swallowing, abdominal pain, nausea or vomiting.  States that she did recently travel out of town.  Denies any known sick contacts.  States that she has been taking no medication at home.   URI Presenting symptoms: congestion, rhinorrhea and sore throat   Associated symptoms: myalgias        Home Medications Prior to Admission medications   Medication Sig Start Date End Date Taking? Authorizing Provider  dicyclomine (BENTYL) 10 MG capsule Take by mouth. 12/04/16   [provider]  DULoxetine (CYMBALTA) 30 MG capsule Take by mouth. 01/02/17   [provider]  DULOXETINE HCL PO duloxetine    [provider]  ergocalciferol (VITAMIN D2) 1.25 MG (50000 UT) capsule Vitamin D2 1,250 mcg (50,000 unit) capsule 01/02/17   [provider]  ibuprofen (ADVIL) 200 MG tablet ibuprofen 200 mg tablet  Take 4 tablets as needed by oral route.    [provider]  nitrofurantoin, macrocrystal-monohydrate, (MACROBID) 100 MG capsule Take 1 capsule (100 mg total) by mouth 2 (two) times daily. X 7 days 11/13/21   Dione Booze, MD  norethindrone-ethinyl estradiol (MICROGESTIN) 1-20 MG-MCG tablet Microgestin FE 1/20 (28) 1 mg-20 mcg (21)/75 mg (7) tablet  TK 1 T PO  QD    [provider]  nystatin ointment (MYCOSTATIN) nystatin 100,000 unit/gram topical ointment  APP AA 2-3 TIMES A DAY    [provider]  omeprazole (PRILOSEC) 20 MG capsule Take by mouth. 01/21/17   [provider]  triamcinolone  ointment (KENALOG) 0.1 % triamcinolone acetonide 0.1 % topical ointment  APP A THIN LAYER TO AA 2-3 TIMES A DAY    [provider]      Allergies    Patient has no known allergies.    Review of Systems   Review of Systems  Constitutional:  Positive for chills.  HENT:  Positive for congestion, rhinorrhea and sore throat.   Musculoskeletal:  Positive for myalgias.  All other systems reviewed and are negative.   Physical Exam Updated Vital Signs BP (!) 176/88   Pulse 77   Temp 98.7 F (37.1 C)   Resp 18   Ht 5\' 3"  (1.6 m)   Wt 73.9 kg   LMP 05/14/2023   SpO2 100%   BMI 28.86 kg/m  Physical Exam Vitals and nursing note reviewed.  Constitutional:      General: She is not in acute distress.    Appearance: Normal appearance. She is not ill-appearing, toxic-appearing or diaphoretic.  HENT:     Head: Normocephalic and atraumatic.     Nose: Nose normal.     Mouth/Throat:     Mouth: Mucous membranes are moist.     Pharynx: Oropharynx is clear. No oropharyngeal exudate or posterior oropharyngeal erythema.  Eyes:     Extraocular Movements: Extraocular movements intact.     Conjunctiva/sclera: Conjunctivae normal.     Pupils: Pupils are equal, round, and reactive to light.  Cardiovascular:     Rate and Rhythm: Normal  rate and regular rhythm.  Pulmonary:     Effort: Pulmonary effort is normal.     Breath sounds: Normal breath sounds. No wheezing.  Abdominal:     General: Abdomen is flat. Bowel sounds are normal.     Palpations: Abdomen is soft.     Tenderness: There is no abdominal tenderness.  Musculoskeletal:     Cervical back: Normal range of motion and neck supple. No tenderness.  Skin:    General: Skin is warm and dry.     Capillary Refill: Capillary refill takes less than 2 seconds.  Neurological:     Mental Status: She is alert and oriented to person, place, and time.     ED Results / Procedures / Treatments   Labs (all labs ordered are listed, but  only abnormal results are displayed) Labs Reviewed  RESP PANEL BY RT-PCR (RSV, FLU A&B, COVID)  [id number]    EKG None  Radiology No results found.  Procedures Procedures   Medications Ordered in ED Medications - No data to display  ED Course/ Medical Decision Making/ A&P  Medical Decision Making  38 year old female presents to ED for evaluation of URI symptoms.  Please see HPI for further details.  On examination patient is afebrile and nontachycardic.  Lung sounds are clear bilaterally, not hypoxic.  Posterior oropharynx has no erythema or exudate.  She is overall nontoxic in appearance.  Viral testing negative for all.  Patient most likely suffering from viral URI.  Will write her out of work and advised conservative treatment at home.  Stable to discharge.   Final Clinical Impression(s) / ED Diagnoses Final diagnoses:  Viral URI with cough    Rx / DC Orders ED Discharge Orders     None         Al Decant, PA-C 05/19/23 2248    Alvira Monday, MD 05/20/23 1045

## 2023-05-19 NOTE — Discharge Instructions (Signed)
Please treat symptoms conservatively at home.  Please take NyQuil, DayQuil.  You may alternate Tylenol and ibuprofen.  Please purchase lidocaine.  He may also push fluids such as emergenc and Pedialyte.  Please see work note.

## 2023-09-11 ENCOUNTER — Emergency Department (HOSPITAL_BASED_OUTPATIENT_CLINIC_OR_DEPARTMENT_OTHER)
Admission: EM | Admit: 2023-09-11 | Discharge: 2023-09-11 | Disposition: A | Discharge status: Home / Self Care | Payer: Medicaid Other | Attending: Emergency Medicine | Admitting: Emergency Medicine

## 2023-09-11 ENCOUNTER — Encounter (HOSPITAL_BASED_OUTPATIENT_CLINIC_OR_DEPARTMENT_OTHER): Payer: Self-pay

## 2023-09-11 ENCOUNTER — Other Ambulatory Visit: Payer: Self-pay

## 2023-09-11 ENCOUNTER — Emergency Department (HOSPITAL_BASED_OUTPATIENT_CLINIC_OR_DEPARTMENT_OTHER): Payer: Medicaid Other

## 2023-09-11 DIAGNOSIS — R072 Precordial pain: Secondary | ICD-10-CM | POA: Diagnosis not present

## 2023-09-11 DIAGNOSIS — R030 Elevated blood-pressure reading, without diagnosis of hypertension: Secondary | ICD-10-CM | POA: Diagnosis not present

## 2023-09-11 DIAGNOSIS — R079 Chest pain, unspecified: Secondary | ICD-10-CM | POA: Diagnosis present

## 2023-09-11 LAB — BASIC METABOLIC PANEL
Anion gap: 10 (ref 5–15)
BUN: 10 mg/dL (ref 6–20)
CO2: 24 mmol/L (ref 22–32)
Calcium: 9.3 mg/dL (ref 8.9–10.3)
Chloride: 105 mmol/L (ref 98–111)
Creatinine, Ser: 0.58 mg/dL (ref 0.44–1.00)
GFR, Estimated: >60 mL/min (ref >60)
Glucose, Bld: 76 mg/dL (ref 70–99)
Potassium: 3.5 mmol/L (ref 3.5–5.1)
Sodium: 139 mmol/L (ref 135–145)

## 2023-09-11 LAB — CBC
HCT: 36.3 % (ref 36.0–46.0)
Hemoglobin: 11.4 g/dL — ABNORMAL LOW (ref 12.0–15.0)
MCH: 25.8 pg — ABNORMAL LOW (ref 26.0–34.0)
MCHC: 31.4 g/dL (ref 30.0–36.0)
MCV: 82.1 fL (ref 80.0–100.0)
Platelets: 347 10*3/uL (ref 150–400)
RBC: 4.42 MIL/uL (ref 3.87–5.11)
RDW: 14 % (ref 11.5–15.5)
WBC: 4.3 10*3/uL (ref 4.0–10.5)
nRBC: 0 % (ref 0.0–0.2)

## 2023-09-11 LAB — TROPONIN I (HIGH SENSITIVITY): Troponin I (High Sensitivity): 3 ng/L (ref <18)

## 2023-09-11 LAB — PREGNANCY, URINE: Preg Test, Ur: NEGATIVE

## 2023-09-11 NOTE — ED Triage Notes (Signed)
 Pt reports recurrent chest pain that started couple of months ago. Sent from PCP with abnormal ECG (ST elevation) and elevated BP.

## 2023-09-11 NOTE — ED Notes (Signed)
 Discharge paperwork reviewed entirely with patient, including follow up care. Pain was under control. No prescriptions were called in, but all questions were addressed.  Pt verbalized understanding as well as all parties involved. No questions or concerns voiced at the time of discharge. No acute distress noted.   Pt ambulated out to PVA without incident or assistance.  Pt advised they will seek followup care with a specialist and followup with their PCP.   The pt was instructed to set up and/or review MyChart for their results; and was informed their Providers all have access to the information as well.

## 2023-09-11 NOTE — Discharge Instructions (Addendum)
 You were seen in the emergency department today for chest pain.  As we discussed your lab work, EKG, chest x-ray all looked reassuring today.   Your blood pressure is a little high today.  Please take the medication that was prescribed to you by your primary doctor earlier today for management of this.  Please purchase a blood pressure cuff and monitor your readings regularly at home.  Write your readings down and bring them to your primary doctor's office at your next appointment.  Please follow-up closely with your primary care doctor.  I recommend monitoring your stress levels.  Continue to monitor how you are doing overall, and return to the emergency department for any new or worsening symptoms such as: Worsening pain or pain with exertion, difficulty breathing, sweating, or pain or swelling in your legs.

## 2023-09-11 NOTE — ED Notes (Signed)
 VP attempt x1 unsuccessful.  Patient tolerated well.
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
## 2023-09-11 NOTE — ED Notes (Signed)
 Cannot discharge, registration in chart.
